# Patient Record
Sex: Male | Born: 1980 | Hispanic: Yes | Marital: Single | Smoking: Never smoker
Health system: Southern US, Community
[De-identification: ages and names within clinical notes are randomized; demographics above are authoritative.]

---

## 2018-08-09 ENCOUNTER — Emergency Department (HOSPITAL_BASED_OUTPATIENT_CLINIC_OR_DEPARTMENT_OTHER)
Admission: EM | Admit: 2018-08-09 | Discharge: 2018-08-09 | Disposition: A | Discharge status: Home / Self Care | Payer: BLUE CROSS/BLUE SHIELD | Attending: Emergency Medicine | Admitting: Emergency Medicine

## 2018-08-09 ENCOUNTER — Emergency Department (HOSPITAL_BASED_OUTPATIENT_CLINIC_OR_DEPARTMENT_OTHER): Payer: BLUE CROSS/BLUE SHIELD

## 2018-08-09 ENCOUNTER — Other Ambulatory Visit: Payer: Self-pay

## 2018-08-09 ENCOUNTER — Encounter (HOSPITAL_BASED_OUTPATIENT_CLINIC_OR_DEPARTMENT_OTHER): Payer: Self-pay | Admitting: Emergency Medicine

## 2018-08-09 DIAGNOSIS — N200 Calculus of kidney: Secondary | ICD-10-CM | POA: Insufficient documentation

## 2018-08-09 DIAGNOSIS — R109 Unspecified abdominal pain: Secondary | ICD-10-CM | POA: Diagnosis present

## 2018-08-09 LAB — CBC WITH DIFFERENTIAL/PLATELET
Abs Immature Granulocytes: 0.01 10*3/uL (ref 0.00–0.07)
Basophils Absolute: 0 10*3/uL (ref 0.0–0.1)
Basophils Relative: 0 %
Eosinophils Absolute: 0.1 10*3/uL (ref 0.0–0.5)
Eosinophils Relative: 1 %
HCT: 46.9 % (ref 39.0–52.0)
Hemoglobin: 15.9 g/dL (ref 13.0–17.0)
Immature Granulocytes: 0 %
Lymphocytes Relative: 24 %
Lymphs Abs: 1.7 10*3/uL (ref 0.7–4.0)
MCH: 31.4 pg (ref 26.0–34.0)
MCHC: 33.9 g/dL (ref 30.0–36.0)
MCV: 92.7 fL (ref 80.0–100.0)
Monocytes Absolute: 0.5 10*3/uL (ref 0.1–1.0)
Monocytes Relative: 7 %
Neutro Abs: 4.9 10*3/uL (ref 1.7–7.7)
Neutrophils Relative %: 68 %
Platelets: 223 10*3/uL (ref 150–400)
RBC: 5.06 MIL/uL (ref 4.22–5.81)
RDW: 12.1 % (ref 11.5–15.5)
WBC: 7.3 10*3/uL (ref 4.0–10.5)
nRBC: 0 % (ref 0.0–0.2)

## 2018-08-09 LAB — COMPREHENSIVE METABOLIC PANEL
ALT: 26 U/L (ref 0–44)
AST: 24 U/L (ref 15–41)
Albumin: 4.6 g/dL (ref 3.5–5.0)
Alkaline Phosphatase: 61 U/L (ref 38–126)
Anion gap: 11 (ref 5–15)
BUN: 6 mg/dL (ref 6–20)
CO2: 23 mmol/L (ref 22–32)
Calcium: 9.1 mg/dL (ref 8.9–10.3)
Chloride: 105 mmol/L (ref 98–111)
Creatinine, Ser: 0.77 mg/dL (ref 0.61–1.24)
GFR calc Af Amer: >60 mL/min (ref >60)
GFR calc non Af Amer: >60 mL/min (ref >60)
Glucose, Bld: 100 mg/dL — ABNORMAL HIGH (ref 70–99)
Potassium: 3.5 mmol/L (ref 3.5–5.1)
Sodium: 139 mmol/L (ref 135–145)
Total Bilirubin: 0.6 mg/dL (ref 0.3–1.2)
Total Protein: 7.5 g/dL (ref 6.5–8.1)

## 2018-08-09 LAB — URINALYSIS, MICROSCOPIC (REFLEX): Bacteria, UA: NONE SEEN

## 2018-08-09 LAB — LIPASE, BLOOD: Lipase: 37 U/L (ref 11–51)

## 2018-08-09 LAB — URINALYSIS, ROUTINE W REFLEX MICROSCOPIC
Bilirubin Urine: NEGATIVE
Glucose, UA: NEGATIVE mg/dL
Ketones, ur: NEGATIVE mg/dL
Leukocytes,Ua: NEGATIVE
Nitrite: NEGATIVE
Protein, ur: NEGATIVE mg/dL
Specific Gravity, Urine: <1.005 — ABNORMAL LOW (ref 1.005–1.030)
pH: 7 (ref 5.0–8.0)

## 2018-08-09 MED ORDER — ONDANSETRON HCL 4 MG/2ML IJ SOLN
4.0000 mg | Freq: Once | INTRAMUSCULAR | Status: DC
  Filled 2018-08-09: qty 2

## 2018-08-09 MED ORDER — ONDANSETRON HCL 4 MG PO TABS: 4.0000 mg | ORAL_TABLET | Freq: Four times a day (QID) | ORAL | 0 refills | Status: AC

## 2018-08-09 MED ORDER — FENTANYL CITRATE (PF) 100 MCG/2ML IJ SOLN
50.0000 ug | Freq: Once | INTRAMUSCULAR | Status: AC
  Administered 2018-08-09: 50 ug via INTRAVENOUS
  Filled 2018-08-09: qty 2

## 2018-08-09 MED ORDER — HYDROCODONE-ACETAMINOPHEN 5-325 MG PO TABS: 1.0000 | ORAL_TABLET | Freq: Four times a day (QID) | ORAL | 0 refills | Status: AC | PRN

## 2018-08-09 MED ORDER — SODIUM CHLORIDE 0.9 % IV BOLUS
1000.0000 mL | Freq: Once | INTRAVENOUS | Status: AC
  Administered 2018-08-09: 16:00:00 1000 mL via INTRAVENOUS

## 2018-08-09 NOTE — ED Triage Notes (Signed)
L flank pain since last night, vomited once.

## 2018-08-09 NOTE — ED Provider Notes (Signed)
[facility] Provider Note   CSN: [id number] Arrival date & time: 08/09/18  1434    History   Chief Complaint Chief Complaint  Patient presents with   Flank Pain    HPI Matthew [facility] is a 38 y.o. male.     The history is provided by the patient.  Flank Pain This is a new problem. The current episode started yesterday. The problem occurs constantly. The problem has not changed since onset.Associated symptoms include abdominal pain. Pertinent negatives include no chest pain, no headaches and no shortness of breath. Nothing aggravates the symptoms. Nothing relieves the symptoms. He has tried nothing for the symptoms. The treatment provided no relief.    History reviewed. No pertinent past medical history.  There are no active problems to display for this patient.   History reviewed. No pertinent surgical history.      Home Medications    Prior to Admission medications   Medication Sig Start Date End Date Taking? Authorizing Provider  HYDROcodone-acetaminophen (NORCO) 5-325 MG tablet Take 1 tablet by mouth every 6 (six) hours as needed for up to 15 doses for moderate pain. 08/09/18   Carita Sollars, DO  ondansetron (ZOFRAN) 4 MG tablet Take 1 tablet (4 mg total) by mouth every 6 (six) hours for 20 doses. 08/09/18 08/14/18  Lennice Sites, DO    Family History No family history on file.  Social History Social History   Tobacco Use   Smoking status: Never Smoker   Smokeless tobacco: Never Used  Substance Use Topics   Alcohol use: Yes   Drug use: Not on file     Allergies   Patient has no known allergies.   Review of Systems Review of Systems  Constitutional: Negative for chills and fever.  HENT: Negative for ear pain and sore throat.   Eyes: Negative for pain and visual disturbance.  Respiratory: Negative for cough and shortness of breath.   Cardiovascular: Negative for chest pain and palpitations.  Gastrointestinal: Positive  for abdominal pain and nausea. Negative for vomiting.  Genitourinary: Positive for flank pain. Negative for dysuria and hematuria.  Musculoskeletal: Negative for arthralgias and back pain.  Skin: Negative for color change and rash.  Neurological: Negative for seizures, syncope and headaches.  All other systems reviewed and are negative.    Physical Exam Updated Vital Signs BP 140/88 (BP Location: Left Arm)    Pulse (!) 56    Temp 99.1 F (37.3 C) (Oral)    Resp 16    Ht 5\' 7"  (1.702 m)    Wt 73.5 kg    SpO2 98%    BMI 25.37 kg/m   Physical Exam Vitals signs and nursing note reviewed.  Constitutional:      General: He is not in acute distress.    Appearance: He is well-developed. He is not ill-appearing.  HENT:     Head: Normocephalic and atraumatic.     Mouth/Throat:     Mouth: Mucous membranes are moist.  Eyes:     Extraocular Movements: Extraocular movements intact.     Conjunctiva/sclera: Conjunctivae normal.     Pupils: Pupils are equal, round, and reactive to light.  Neck:     Musculoskeletal: Normal range of motion and neck supple.  Cardiovascular:     Rate and Rhythm: Normal rate and regular rhythm.     Pulses: Normal pulses.     Heart sounds: Normal heart sounds. No murmur.  Pulmonary:     Effort: Pulmonary effort  is normal. No respiratory distress.     Breath sounds: Normal breath sounds.  Abdominal:     General: There is no distension.     Palpations: Abdomen is soft.     Tenderness: There is no abdominal tenderness. There is left CVA tenderness. There is no guarding or rebound.  Musculoskeletal: Normal range of motion.        General: No tenderness.  Skin:    General: Skin is warm and dry.     Capillary Refill: Capillary refill takes less than 2 seconds.  Neurological:     General: No focal deficit present.     Mental Status: He is alert.  Psychiatric:        Mood and Affect: Mood normal.      ED Treatments / Results  Labs (all labs ordered are  listed, but only abnormal results are displayed) Labs Reviewed  URINALYSIS, ROUTINE W REFLEX MICROSCOPIC - Abnormal; Notable for the following components:      Result Value   Color, Urine STRAW (*)    Specific Gravity, Urine <1.005 (*)    Hgb urine dipstick TRACE (*)    All other components within normal limits  COMPREHENSIVE METABOLIC PANEL - Abnormal; Notable for the following components:   Glucose, Bld 100 (*)    All other components within normal limits  URINALYSIS, MICROSCOPIC (REFLEX)  CBC WITH DIFFERENTIAL/PLATELET  LIPASE, BLOOD    EKG None  Radiology Ct Renal Stone Study  Result Date: 08/09/2018 CLINICAL DATA:  Left flank pain with nausea and dysuria EXAM: CT ABDOMEN AND PELVIS WITHOUT CONTRAST TECHNIQUE: Multidetector CT imaging of the abdomen and pelvis was performed following the standard protocol without oral or IV contrast. COMPARISON:  None. FINDINGS: Lower chest: Lung bases are clear. Hepatobiliary: No focal liver lesions are appreciable on this noncontrast enhanced study. The gallbladder wall is not appreciably thickened. There is no biliary duct dilatation. Pancreas: There is no pancreatic mass or inflammatory focus. Spleen: No splenic lesions are evident. Adrenals/Urinary Tract: Adrenals appear normal bilaterally. There is no evident renal mass on either side. There is mild hydronephrosis on the left. There is no appreciable hydronephrosis on the right. There is no appreciable intrarenal calculus on either side. There is a 1 mm calculus at the left ureterovesical junction. No other ureteral calculi are evident on either side. Urinary bladder is midline with wall thickness within normal limits. Stomach/Bowel: There is no appreciable bowel wall or mesenteric thickening. There is no evident bowel obstruction. Terminal ileum appears unremarkable. There is no evident free air or portal venous air. Vascular/Lymphatic: There is no abdominal aortic aneurysm. No vascular lesions are  evident. There is a retroaortic left renal vein, an anatomic variant. There is no evident adenopathy in the abdomen or pelvis. Reproductive: Prostate and seminal vesicles are normal in size and contour. No evident pelvic mass. Other: Appendix appears unremarkable. There is no abscess or ascites evident in the abdomen or pelvis. There is a rather minimal ventral hernia containing only fat. Musculoskeletal: No blastic or lytic bone lesions. No intramuscular lesion. IMPRESSION: 1. 1 mm calculus at the left ureterovesical junction causing mild hydronephrosis on the left. 2. No bowel obstruction. No abscess in the abdomen or pelvis. Appendix appears unremarkable. 3.  Rather minimal ventral hernia containing only fat. Electronically Signed   By: Bretta BangWilliam  Woodruff III M.D.   On: 08/09/2018 15:47    Procedures Procedures (including critical care time)  Medications Ordered in ED Medications  ondansetron (ZOFRAN) injection 4  mg (0 mg Intravenous Hold 08/09/18 1554)  fentaNYL (SUBLIMAZE) injection 50 mcg (50 mcg Intravenous Given 08/09/18 1531)  sodium chloride 0.9 % bolus 1,000 mL (1,000 mLs Intravenous New Bag/Given 08/09/18 1554)     Initial Impression / Assessment and Plan / ED Course  I have reviewed the triage vital signs and the nursing notes.  Pertinent labs & imaging results that were available during my care of the patient were reviewed by me and considered in my medical decision making (see chart for details).        Wynne DustJose [facility] is a 38 year old male with no significant medical history who presents to the ED with left flank pain.  Concern for kidney stone.  Patient with normal vitals.  No fever.  Has had intermittent left-sided flank pain since last night with nausea and vomiting.  Denies any trauma.  No obvious hematuria.  Has some left CVA tenderness on exam.  Urinalysis negative for infection but does show some blood.  Concern for kidney stone will get a CT scan.  We will get labs as well.   Will give IV fluids, IV fentanyl, IV Zofran and reevaluate.  Patient found to have 1 mm left-sided kidney stone.  Patient otherwise no significant leukocytosis.  No urinary tract infection.  Pain has been controlled with fentanyl.  No active narcotic prescription.  Will provide narcotic prescription, Zofran.  Given return precautions.  Anticipate that the stone will pass easily.  Educated about kidney stones.  Discharged from ED in good condition.  This chart was dictated using voice recognition software.  Despite best efforts to proofread,  errors can occur which can change the documentation meaning.    Final Clinical Impressions(s) / ED Diagnoses   Final diagnoses:  Kidney stone    ED Discharge Orders         Ordered    HYDROcodone-acetaminophen (NORCO) 5-325 MG tablet  Every 6 hours PRN     08/09/18 1618    ondansetron (ZOFRAN) 4 MG tablet  Every 6 hours     08/09/18 1618           Virgina NorfolkCuratolo, Khyra Viscuso, DO 08/09/18 1619

## 2018-08-09 NOTE — ED Notes (Signed)
Patient transported to CT 

## 2019-04-10 ENCOUNTER — Ambulatory Visit: Payer: BC Managed Care – PPO | Attending: Internal Medicine

## 2019-04-10 DIAGNOSIS — Z23 Encounter for immunization: Secondary | ICD-10-CM

## 2019-04-10 NOTE — Progress Notes (Signed)
   Covid-19 Vaccination Clinic  Name:  Evrett Hakim    MRN: [id number] DOB: 10/12/1980  04/10/2019  Mr. Lunz was observed post Covid-19 immunization for 15 minutes without incident. He was provided with Vaccine Information Sheet and instruction to access the V-Safe system.   Mr. Iglesia was instructed to call 911 with any severe reactions post vaccine: Marland Kitchen Difficulty breathing  . Swelling of face and throat  . A fast heartbeat  . A bad rash all over body  . Dizziness and weakness   Immunizations Administered    Name Date Dose VIS Date Route   Pfizer COVID-19 Vaccine 04/10/2019  8:10 AM 0.3 mL 12/18/2018 Intramuscular   Manufacturer: [employer], [employer]   Lot: [id number]   NDC: [address]-8

## 2019-05-04 ENCOUNTER — Ambulatory Visit: Payer: BC Managed Care – PPO | Attending: Internal Medicine

## 2019-05-04 DIAGNOSIS — Z23 Encounter for immunization: Secondary | ICD-10-CM

## 2019-05-04 NOTE — Progress Notes (Signed)
   Covid-19 Vaccination Clinic  Name:  [facility]    MRN: [id number] DOB: Jun 22, 1980  05/04/2019  [name] was observed post Covid-19 immunization for 15 minutes without incident. He was provided with Vaccine Information Sheet and instruction to access the V-Safe system.   [name] was instructed to call 911 with any severe reactions post vaccine: Marland Kitchen Difficulty breathing  . Swelling of face and throat  . A fast heartbeat  . A bad rash all over body  . Dizziness and weakness   Immunizations Administered    Name Date Dose VIS Date Route   Pfizer COVID-19 Vaccine 05/04/2019  2:42 PM 0.3 mL 03/03/2018 Intramuscular   Manufacturer: [employer], [employer]   Lot: [id number]   NDC: [address]-0

## 2020-02-17 IMAGING — CT CT RENAL STONE PROTOCOL
2 of 4 series · 16 of 46 positions shown, 18 images · non-contrast
Comparison: None.

CLINICAL DATA: Left flank pain with nausea and dysuria

EXAM:
CT ABDOMEN AND PELVIS WITHOUT CONTRAST
TECHNIQUE: Multidetector CT imaging of the abdomen and pelvis was performed
following the standard protocol without oral or IV contrast.

[Series 2: axial st · axial · 0.74mm/px · z∈[-478,-48]mm · 13 of 94 slices shown, 15 images]
[im 4/94  soft-tissue]
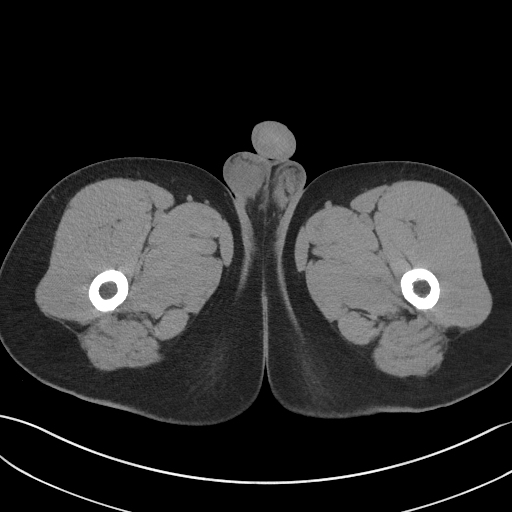
[im 4/94  bone]
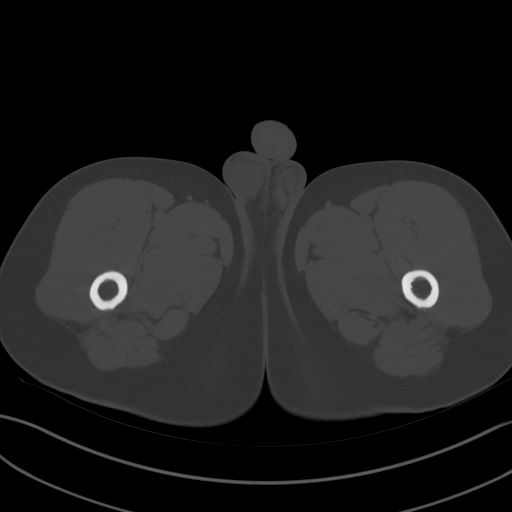
[im 11/94  soft-tissue]
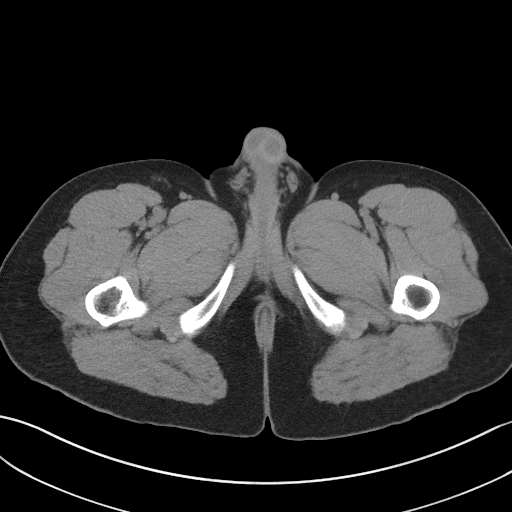
[im 18/94  soft-tissue]
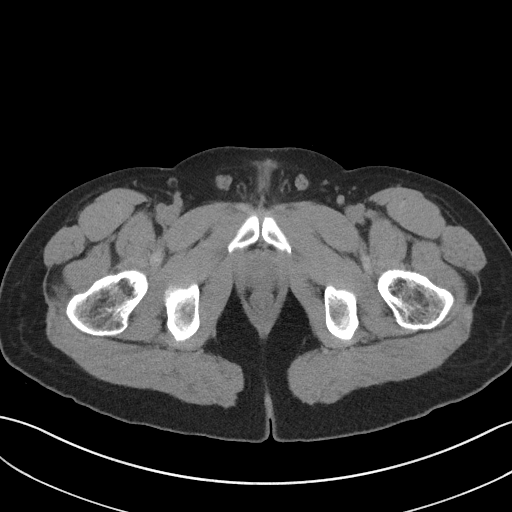
[im 26/94  soft-tissue]
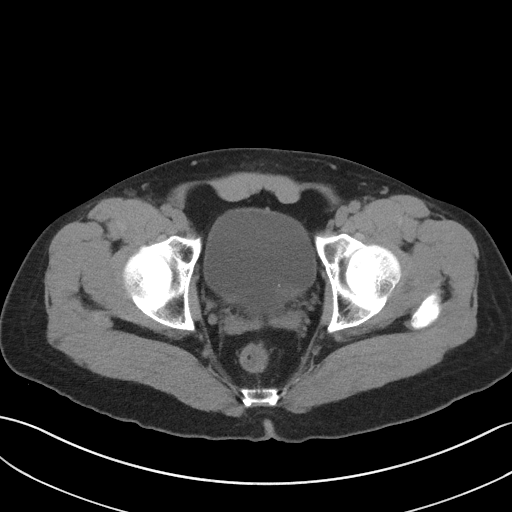
[im 33/94  soft-tissue]
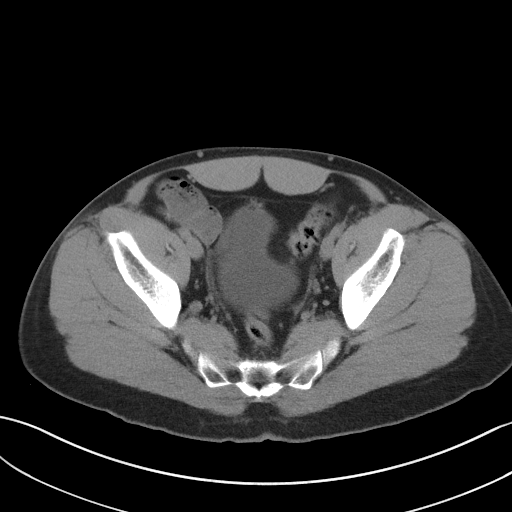
[im 40/94  soft-tissue]
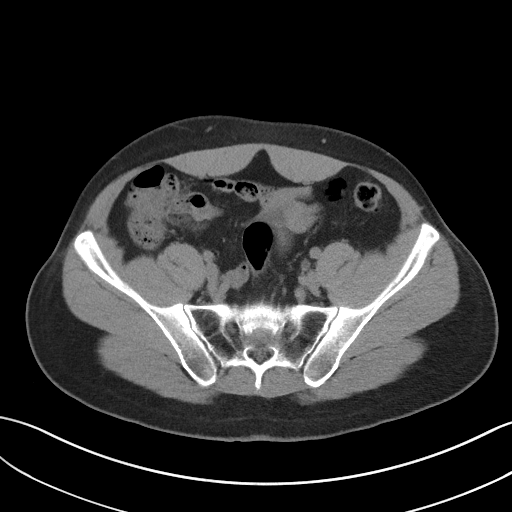
[im 47/94  soft-tissue]
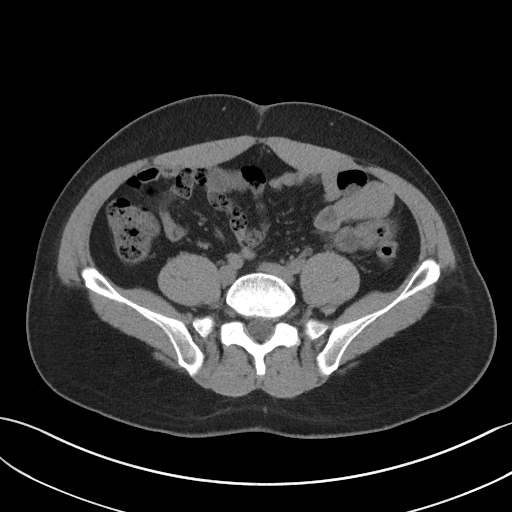
[im 54/94  soft-tissue]
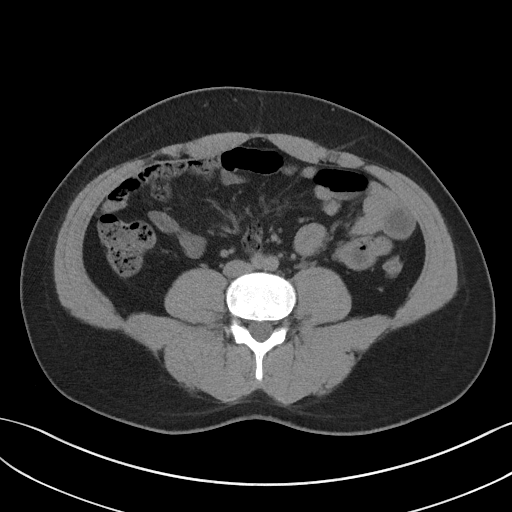
[im 61/94  soft-tissue]
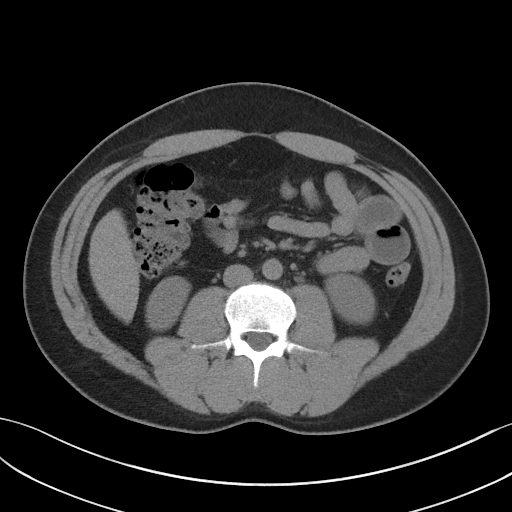
[im 61/94  bone]
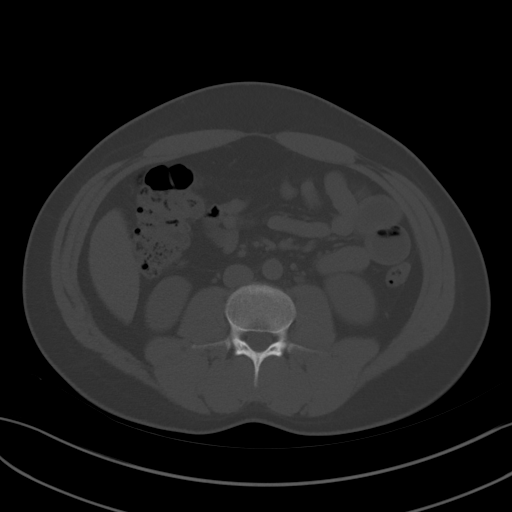
[im 68/94  soft-tissue]
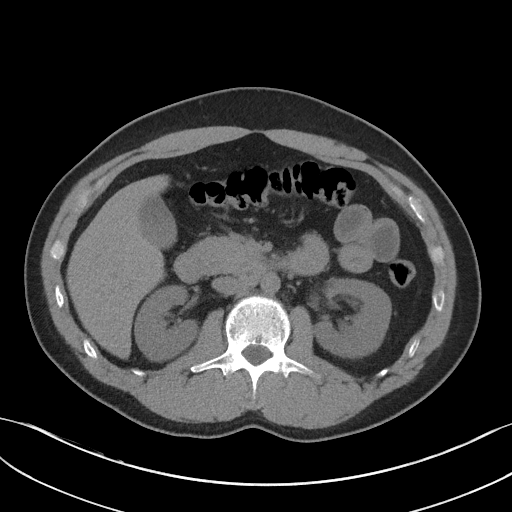
[im 76/94  soft-tissue]
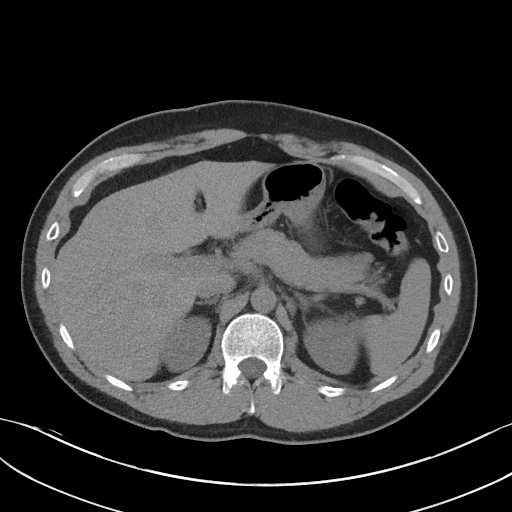
[im 83/94  soft-tissue]
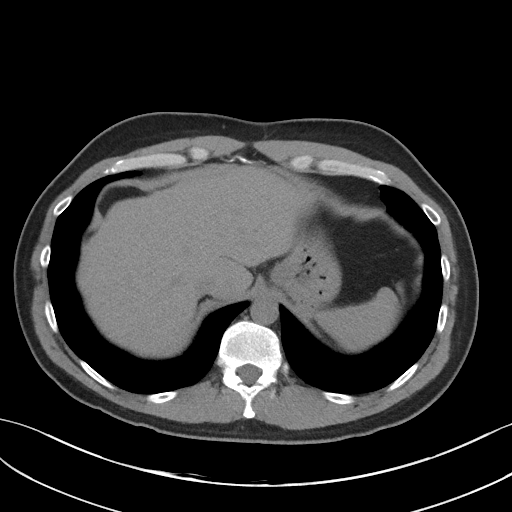
[im 90/94  soft-tissue]
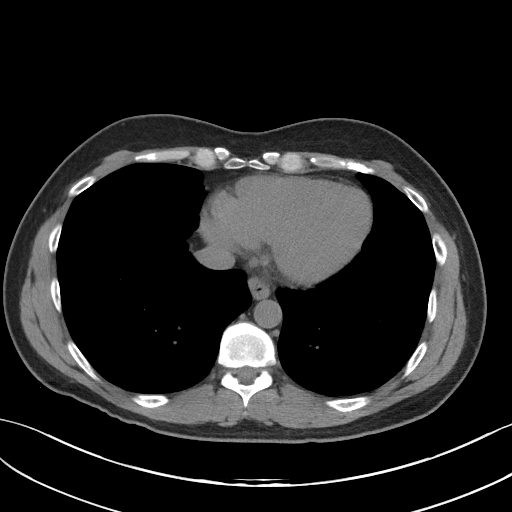

[Series 5: coronal st · coronal · 0.79mm/px · 3 of 90 slices shown]
[im 30/90  soft-tissue]
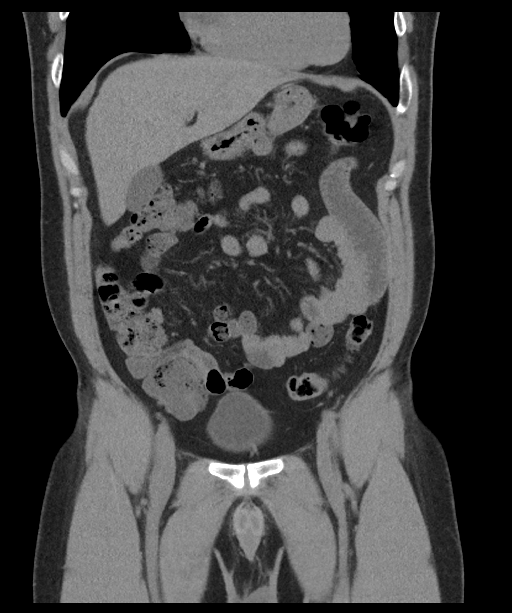
[im 40/90  soft-tissue]
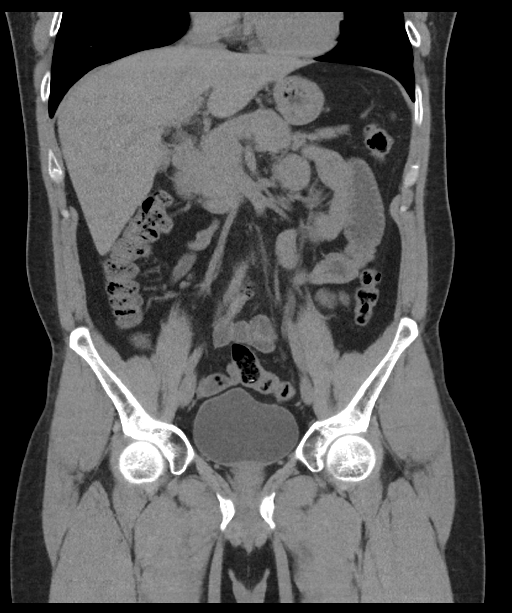
[im 50/90  soft-tissue]
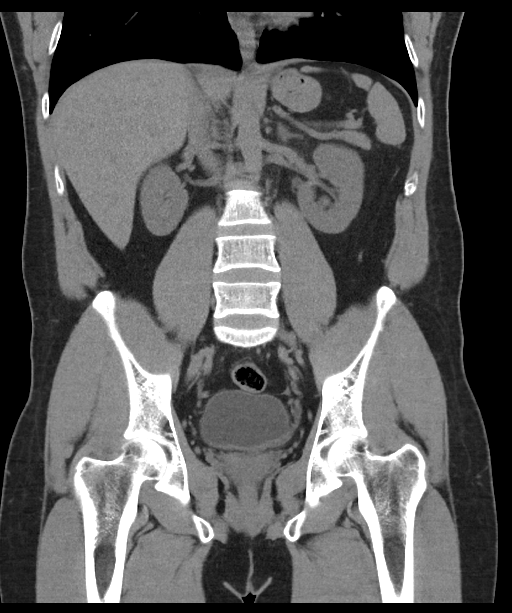

[16 of 46 positions shown; findings below may reference images not displayed]

FINDINGS: Lower chest: Lung bases are clear.

Hepatobiliary: No focal liver lesions are appreciable on this
noncontrast enhanced study. The gallbladder wall is not appreciably
thickened. There is no biliary duct dilatation.

Pancreas: There is no pancreatic mass or inflammatory focus.

Spleen: No splenic lesions are evident.

Adrenals/Urinary Tract: Adrenals appear normal bilaterally. There is
no evident renal mass on either side. There is mild hydronephrosis
on the left. There is no appreciable hydronephrosis on the right.
There is no appreciable intrarenal calculus on either side. There is
a 1 mm calculus at the left ureterovesical junction. No other
ureteral calculi are evident on either side. Urinary bladder is
midline with wall thickness within normal limits.

Stomach/Bowel: There is no appreciable bowel wall or mesenteric
thickening. There is no evident bowel obstruction. Terminal ileum
appears unremarkable. There is no evident free air or portal venous
air.

Vascular/Lymphatic: There is no abdominal aortic aneurysm. No
vascular lesions are evident. There is a retroaortic left renal
vein, an anatomic variant. There is no evident adenopathy in the
abdomen or pelvis.

Reproductive: Prostate and seminal vesicles are normal in size and
contour. No evident pelvic mass.

Other: Appendix appears unremarkable. There is no abscess or ascites
evident in the abdomen or pelvis. There is a rather minimal ventral
hernia containing only fat.

Musculoskeletal: No blastic or lytic bone lesions. No intramuscular
lesion.
IMPRESSION: 1. 1 mm calculus at the left ureterovesical junction causing mild
hydronephrosis on the left.

2. No bowel obstruction. No abscess in the abdomen or pelvis.
Appendix appears unremarkable.

3.  Rather minimal ventral hernia containing only fat.
